# Patient Record
Sex: Female | Born: 2005 | Race: White | Hispanic: No | Marital: Single | Smoking: Never smoker
Health system: Southern US, Community
[De-identification: ages and names within clinical notes are randomized; demographics above are authoritative.]

## PROBLEM LIST (undated history)

## (undated) DIAGNOSIS — D649 Anemia, unspecified: Secondary | ICD-10-CM

## (undated) HISTORY — PX: WISDOM TOOTH EXTRACTION: SHX21

## (undated) HISTORY — DX: Anemia, unspecified: D64.9

## (undated) HISTORY — PX: TONSILLECTOMY: SUR1361

---
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
## 2023-02-27 DIAGNOSIS — Z6828 Body mass index (BMI) 28.0-28.9, adult: Secondary | ICD-10-CM | POA: Diagnosis not present

## 2023-02-27 DIAGNOSIS — Z01419 Encounter for gynecological examination (general) (routine) without abnormal findings: Secondary | ICD-10-CM | POA: Diagnosis not present

## 2023-02-27 DIAGNOSIS — T148XXA Other injury of unspecified body region, initial encounter: Secondary | ICD-10-CM | POA: Diagnosis not present

## 2023-06-16 DIAGNOSIS — K117 Disturbances of salivary secretion: Secondary | ICD-10-CM | POA: Diagnosis not present

## 2023-06-20 DIAGNOSIS — K119 Disease of salivary gland, unspecified: Secondary | ICD-10-CM | POA: Diagnosis not present

## 2023-07-14 ENCOUNTER — Other Ambulatory Visit (HOSPITAL_BASED_OUTPATIENT_CLINIC_OR_DEPARTMENT_OTHER): Payer: BC Managed Care – PPO

## 2023-07-14 ENCOUNTER — Ambulatory Visit: Payer: BC Managed Care – PPO | Admitting: Nurse Practitioner

## 2023-07-14 ENCOUNTER — Encounter: Payer: Self-pay | Admitting: Nurse Practitioner

## 2023-07-14 VITALS — BP 104/72 | HR 100 | Temp 97.4°F | Ht 62.03 in | Wt 150.4 lb

## 2023-07-14 DIAGNOSIS — M2550 Pain in unspecified joint: Secondary | ICD-10-CM | POA: Diagnosis not present

## 2023-07-14 DIAGNOSIS — R590 Localized enlarged lymph nodes: Secondary | ICD-10-CM | POA: Insufficient documentation

## 2023-07-14 LAB — COMPREHENSIVE METABOLIC PANEL
ALT: 12 U/L (ref 0–35)
AST: 14 U/L (ref 0–37)
Albumin: 4.5 g/dL (ref 3.5–5.2)
Alkaline Phosphatase: 78 U/L (ref 47–119)
BUN: 11 mg/dL (ref 6–23)
CO2: 26 meq/L (ref 19–32)
Calcium: 9.7 mg/dL (ref 8.4–10.5)
Chloride: 105 meq/L (ref 96–112)
Creatinine, Ser: 0.62 mg/dL (ref 0.40–1.20)
GFR: 128.77 mL/min (ref >60.00)
Glucose, Bld: 97 mg/dL (ref 70–99)
Potassium: 3.7 meq/L (ref 3.5–5.1)
Sodium: 138 meq/L (ref 135–145)
Total Bilirubin: 0.4 mg/dL (ref 0.2–1.2)
Total Protein: 7.2 g/dL (ref 6.0–8.3)

## 2023-07-14 LAB — CBC WITH DIFFERENTIAL/PLATELET
Basophils Absolute: 0 10*3/uL (ref 0.0–0.1)
Basophils Relative: 0.5 % (ref 0.0–3.0)
Eosinophils Absolute: 0.2 10*3/uL (ref 0.0–0.7)
Eosinophils Relative: 4.2 % (ref 0.0–5.0)
HCT: 42.2 % (ref 36.0–49.0)
Hemoglobin: 13.9 g/dL (ref 12.0–16.0)
Lymphocytes Relative: 9.7 % — ABNORMAL LOW (ref 24.0–48.0)
Lymphs Abs: 0.5 10*3/uL — ABNORMAL LOW (ref 0.7–4.0)
MCHC: 33.1 g/dL (ref 31.0–37.0)
MCV: 93.9 fL (ref 78.0–98.0)
Monocytes Absolute: 0.5 10*3/uL (ref 0.1–1.0)
Monocytes Relative: 9.8 % (ref 3.0–12.0)
Neutro Abs: 3.8 10*3/uL (ref 1.4–7.7)
Neutrophils Relative %: 75.8 % — ABNORMAL HIGH (ref 43.0–71.0)
Platelets: 241 10*3/uL (ref 150.0–575.0)
RBC: 4.49 Mil/uL (ref 3.80–5.70)
RDW: 13 % (ref 11.4–15.5)
WBC: 5 10*3/uL (ref 4.5–13.5)

## 2023-07-14 NOTE — Assessment & Plan Note (Signed)
She has a history of reactive arthritis with occasional symptoms and a family history of autoimmune diseases, though previous autoimmune workup was negative. We will continue current management with turmeric supplementation.

## 2023-07-14 NOTE — Patient Instructions (Signed)
It was great to see you!  We are checking your labs today and will let you know the results via mychart/phone.   I have ordered an ultrasound, they will call to schedule.   I also referred you to ENT, they will call to schedule.   You can use warm compresses or take tylenol or ibuprofen as needed for pain  Let's follow-up based on results   Take care,  Rodman Pickle, NP

## 2023-07-14 NOTE — Assessment & Plan Note (Addendum)
She has experienced persistent left-sided submandibular swelling for approximately 1.5-2 months, fluctuating in size and causing pain, especially when the neck is moved, during yawning, and stretching. Despite no respiratory symptoms or difficulty swallowing, the specific cause of the swelling remains unclear following a previous evaluation suggesting a viral etiology. We will order an urgent ultrasound to identify the nature of the swelling, request previous lab results from the on-campus doctor for comparison, check a complete blood count (CBC) today, and refer her to an Ear, Nose, and Throat (ENT) specialist for further evaluation. She can use warm compresses and alternate tylenol and ibuprofen as needed for pain.

## 2023-07-14 NOTE — Progress Notes (Signed)
Acute Office Visit  Subjective:     Patient ID: Tamara Dennis, female    DOB: September 29, 2005, 17 y.o.   MRN: [id number]  Chief Complaint  Patient presents with   Swollen Lymph Node in Neck    Left side of neck swollen for 2 months     HPI Patient is in today for swollen area under jaw on left side for 2 months.   Discussed the use of AI scribe software for clinical note transcription with the patient, who gave verbal consent to proceed.  History of Present Illness   The patient, with a history of reactive arthritis, presents with a swelling on the left side of her neck that has been present for about one and a half to two months. The swelling fluctuates in size and causes discomfort, especially when it is large. It restricts her neck movements and causes pain when yawning or stretching the skin.   The patient sought medical attention at an on-campus clinic where she was initially suspected to have mumps and was advised to stay indoors. However, subsequent blood tests, including tests for mumps, COVID, flu, and strep, were all negative. The patient reports that the swelling tends to increase in size when she is unwell, such as during a recent cold. She states that over the last few weeks, it has been fluctuating in size.   The patient also reports a history of reactive arthritis, which occasionally causes discomfort in her wrist. She manages this with daily turmeric supplements. The patient has a family history of autoimmune diseases, including Crohn's disease and lupus, which raises her concern about a potential autoimmune etiology for her current symptoms.     ROS See pertinent positives and negatives per HPI.     Objective:    BP 104/72 (BP Location: Left Arm)   Pulse 100   Temp (!) 97.4 F (36.3 C)   Ht 5' 2.03" (1.576 m)   Wt 150 lb 6.4 oz (68.2 kg)   LMP 07/13/2023 (Exact Date)   SpO2 98%   BMI 27.48 kg/m    Physical Exam Vitals and nursing note reviewed.   Constitutional:      General: She is not in acute distress.    Appearance: Normal appearance.  HENT:     Head: Normocephalic.     Right Ear: Tympanic membrane, ear canal and external ear normal.     Left Ear: Tympanic membrane, ear canal and external ear normal.     Mouth/Throat:     Mouth: Mucous membranes are moist.     Pharynx: No posterior oropharyngeal erythema.     Comments: Swollen nodule in left submandibular area Eyes:     Conjunctiva/sclera: Conjunctivae normal.  Cardiovascular:     Rate and Rhythm: Normal rate and regular rhythm.     Pulses: Normal pulses.     Heart sounds: Normal heart sounds.  Pulmonary:     Effort: Pulmonary effort is normal.     Breath sounds: Normal breath sounds.  Musculoskeletal:     Cervical back: Normal range of motion and neck supple. No tenderness.  Lymphadenopathy:     Cervical: No cervical adenopathy.  Skin:    General: Skin is warm.  Neurological:     General: No focal deficit present.     Mental Status: She is alert and oriented to person, place, and time.  Psychiatric:        Mood and Affect: Mood normal.        Behavior: Behavior  normal.        Thought Content: Thought content normal.        Judgment: Judgment normal.       Assessment & Plan:   Problem List Items Addressed This Visit       Immune and Lymphatic   Enlarged lymph node in neck - Primary    She has experienced persistent left-sided submandibular swelling for approximately 1.5-2 months, fluctuating in size and causing pain, especially when the neck is moved, during yawning, and stretching. Despite no respiratory symptoms or difficulty swallowing, the specific cause of the swelling remains unclear following a previous evaluation suggesting a viral etiology. We will order an urgent ultrasound to identify the nature of the swelling, request previous lab results from the on-campus doctor for comparison, check a complete blood count (CBC) today, and refer her to an Ear,  Nose, and Throat (ENT) specialist for further evaluation. She can use warm compresses and alternate tylenol and ibuprofen as needed for pain.       Relevant Orders   CBC with Differential/Platelet   Comprehensive metabolic panel   Ambulatory referral to ENT   US Soft Tissue Head/Neck (NON-THYROID)     Other   Multiple joint pain    She has a history of reactive arthritis with occasional symptoms and a family history of autoimmune diseases, though previous autoimmune workup was negative. We will continue current management with turmeric supplementation.      No orders of the defined types were placed in this encounter.   Return if symptoms worsen or fail to improve.  Gerre Scull, NP

## 2023-07-15 ENCOUNTER — Ambulatory Visit (HOSPITAL_BASED_OUTPATIENT_CLINIC_OR_DEPARTMENT_OTHER)
Admission: RE | Admit: 2023-07-15 | Discharge: 2023-07-15 | Disposition: A | Discharge status: Home / Self Care | Payer: BC Managed Care – PPO | Source: Ambulatory Visit | Attending: Nurse Practitioner | Admitting: Nurse Practitioner

## 2023-07-15 DIAGNOSIS — R590 Localized enlarged lymph nodes: Secondary | ICD-10-CM | POA: Diagnosis not present

## 2023-07-16 ENCOUNTER — Telehealth: Payer: Self-pay | Admitting: Nurse Practitioner

## 2023-07-16 NOTE — Addendum Note (Signed)
Addended by: Rodman Pickle A on: 07/16/2023 11:39 AM   Modules accepted: Orders

## 2023-07-16 NOTE — Telephone Encounter (Signed)
Pts father called wanting to know about the test results and about a biopsy. He is not on her DPR so I gave him no information. I suggested that he be added to the [facility].

## 2023-07-16 NOTE — Telephone Encounter (Signed)
Pt mom need more information about pat getting a biopsy. Please call the pt mom [phone]

## 2023-07-16 NOTE — Telephone Encounter (Signed)
Called and spoke with Victorino Dike, patient's mom and answered all her questions. Encouraged her to reach out if she doesn't hear from ENT next week to schedule an appointment.

## 2023-07-16 NOTE — Telephone Encounter (Signed)
Called and spoke with her mom. Dad is not on DPR.

## 2023-07-16 NOTE — Telephone Encounter (Signed)
Please give mom, Victorino Dike a cb at  [phone]

## 2023-07-22 DIAGNOSIS — L040 Acute lymphadenitis of face, head and neck: Secondary | ICD-10-CM | POA: Diagnosis not present

## 2023-07-24 ENCOUNTER — Telehealth: Payer: Self-pay | Admitting: Nurse Practitioner

## 2023-07-24 ENCOUNTER — Other Ambulatory Visit: Payer: Self-pay | Admitting: Nurse Practitioner

## 2023-07-24 ENCOUNTER — Other Ambulatory Visit: Payer: Self-pay | Admitting: Otolaryngology

## 2023-07-24 DIAGNOSIS — R590 Localized enlarged lymph nodes: Secondary | ICD-10-CM

## 2023-07-24 DIAGNOSIS — L040 Acute lymphadenitis of face, head and neck: Secondary | ICD-10-CM

## 2023-07-24 NOTE — Telephone Encounter (Signed)
I called and spoke with [place] in Medical Records at [facility] and she is faxing over records now.

## 2023-07-24 NOTE — Telephone Encounter (Signed)
Pt's mom, Victorino Dike, called requesting to leave a message for Lauren to call her back. She said pt went to the ENT doctor, and would like to discuss the outcome with her.

## 2023-07-24 NOTE — Telephone Encounter (Signed)
Called and spoke again with Tamara Dennis's mom about ENT notes. Ultrasound done in their office shows the lymph node has decreased in size. Recommend antibiotic as prescribed and to get CT scan that's ordered and scheduled on 08/06/23.

## 2023-07-24 NOTE — Telephone Encounter (Signed)
Called and spoke with Victorino Dike. She states the ENT at [facility] did not think it was cancer and that she didn't need a biopsy. She was placed on antibiotics. Patient and mom would like second opinion. Referral placed with preference to Dr. Jenne Pane. Will request records from Dr. Ala Dach with ENT in [place].

## 2023-07-28 ENCOUNTER — Encounter (INDEPENDENT_AMBULATORY_CARE_PROVIDER_SITE_OTHER): Payer: Self-pay | Admitting: Otolaryngology

## 2023-08-06 ENCOUNTER — Other Ambulatory Visit: Payer: BC Managed Care – PPO

## 2023-08-06 ENCOUNTER — Ambulatory Visit
Admission: RE | Admit: 2023-08-06 | Discharge: 2023-08-06 | Disposition: A | Discharge status: Home / Self Care | Payer: BC Managed Care – PPO | Source: Ambulatory Visit | Attending: Otolaryngology | Admitting: Otolaryngology

## 2023-08-06 DIAGNOSIS — R221 Localized swelling, mass and lump, neck: Secondary | ICD-10-CM | POA: Diagnosis not present

## 2023-08-06 DIAGNOSIS — L040 Acute lymphadenitis of face, head and neck: Secondary | ICD-10-CM

## 2023-08-06 MED ORDER — IOPAMIDOL (ISOVUE-300) INJECTION 61%
75.0000 mL | Freq: Once | INTRAVENOUS | Status: AC | PRN
  Administered 2023-08-06: 75 mL via INTRAVENOUS

## 2023-08-07 DIAGNOSIS — R590 Localized enlarged lymph nodes: Secondary | ICD-10-CM | POA: Diagnosis not present

## 2023-08-07 DIAGNOSIS — R221 Localized swelling, mass and lump, neck: Secondary | ICD-10-CM | POA: Diagnosis not present

## 2023-08-27 ENCOUNTER — Encounter: Payer: Self-pay | Admitting: Nurse Practitioner

## 2023-09-15 DIAGNOSIS — D485 Neoplasm of uncertain behavior of skin: Secondary | ICD-10-CM | POA: Diagnosis not present

## 2023-11-14 DIAGNOSIS — D485 Neoplasm of uncertain behavior of skin: Secondary | ICD-10-CM | POA: Diagnosis not present
